# Patient Record
Sex: Male | Born: 1995 | Race: White | Hispanic: No | Marital: Single | Smoking: Current every day smoker
Health system: Southern US, Community
[De-identification: ages and names within clinical notes are randomized; demographics above are authoritative.]

---
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
## 2019-09-10 ENCOUNTER — Emergency Department (HOSPITAL_COMMUNITY)
Admission: EM | Admit: 2019-09-10 | Discharge: 2019-09-10 | Disposition: A | Discharge status: Home / Self Care | Payer: Managed Care, Other (non HMO) | Attending: Emergency Medicine | Admitting: Emergency Medicine

## 2019-09-10 ENCOUNTER — Other Ambulatory Visit: Payer: Self-pay

## 2019-09-10 ENCOUNTER — Emergency Department (HOSPITAL_COMMUNITY): Payer: Managed Care, Other (non HMO)

## 2019-09-10 ENCOUNTER — Encounter (HOSPITAL_COMMUNITY): Payer: Self-pay | Admitting: *Deleted

## 2019-09-10 DIAGNOSIS — Z20828 Contact with and (suspected) exposure to other viral communicable diseases: Secondary | ICD-10-CM | POA: Diagnosis not present

## 2019-09-10 DIAGNOSIS — R112 Nausea with vomiting, unspecified: Secondary | ICD-10-CM | POA: Diagnosis not present

## 2019-09-10 DIAGNOSIS — J069 Acute upper respiratory infection, unspecified: Secondary | ICD-10-CM

## 2019-09-10 DIAGNOSIS — Z20822 Contact with and (suspected) exposure to covid-19: Secondary | ICD-10-CM

## 2019-09-10 DIAGNOSIS — F17200 Nicotine dependence, unspecified, uncomplicated: Secondary | ICD-10-CM | POA: Diagnosis not present

## 2019-09-10 DIAGNOSIS — R05 Cough: Secondary | ICD-10-CM | POA: Diagnosis present

## 2019-09-10 MED ORDER — ONDANSETRON 4 MG PO TBDP
4.0000 mg | ORAL_TABLET | Freq: Once | ORAL | Status: AC
  Administered 2019-09-10: 4 mg via ORAL
  Filled 2019-09-10: qty 1

## 2019-09-10 MED ORDER — ONDANSETRON 4 MG PO TBDP: ORAL_TABLET | ORAL | 0 refills | Status: AC

## 2019-09-10 NOTE — ED Triage Notes (Signed)
Pt c/o intermittent n/v, cough that is productive occasionally, headache, fever, states that his sister was sick with same symptoms but was negative for covid,

## 2019-09-10 NOTE — ED Notes (Signed)
Pt ambulatory in room, no respiratory distress noted. Pt O2 sats remain 95% with ambulation.

## 2019-09-10 NOTE — ED Provider Notes (Signed)
[facility] EMERGENCY DEPARTMENT Provider Note   CSN: [id number] Arrival date & time: 09/10/19  1342     History   Chief Complaint Chief Complaint  Patient presents with  . Cough    HPI Zachary Collier is a 23 y.o. male.     Zachary Collier is a 23 y.o. male who is otherwise healthy, presents to the ED for evaluation of chills, body aches, cough, nausea, vomiting.  Patient reports symptoms have been persistent and not improving.  He reports cough is occasionally productive of clear sputum.  He has had some associated rhinorrhea and sore throat as well.  Patient reports body aches as well as mild headache.  He has had chills but has not taken his temperature.  Reports he has had multiple episodes of vomiting, states this is worse after he has a coughing spell.  He has not had any diarrhea.  He denies abdominal pain.  Denies any chest pain or shortness of breath.  No difficulty breathing.  He has not taken anything for his symptoms prior to arrival.  Reports his sister had a similar illness a few weeks ago, had negative Covid testing when she was having symptoms, and her symptoms resolved on their own.  No other known sick contacts.     History reviewed. No pertinent past medical history.  There are no active problems to display for this patient.   History reviewed. No pertinent surgical history.      Home Medications    Prior to Admission medications   Medication Sig Start Date End Date Taking? Authorizing Provider  ondansetron (ZOFRAN ODT) 4 MG disintegrating tablet 4mg  ODT q4 hours prn nausea/vomit 09/10/19   14/2/20, PA-C    Family History History reviewed. No pertinent family history.  Social History Social History   Tobacco Use  . Smoking status: Current Every Day Smoker  . Smokeless tobacco: Never Used  Substance Use Topics  . Alcohol use: Not on file  . Drug use: Not on file     Allergies   Patient has no known allergies.   Review of  Systems Review of Systems  Constitutional: Positive for chills. Negative for fever.  HENT: Positive for congestion, rhinorrhea and sore throat.   Respiratory: Positive for cough. Negative for shortness of breath.   Cardiovascular: Negative for chest pain.  Gastrointestinal: Positive for nausea and vomiting. Negative for abdominal pain and diarrhea.  Genitourinary: Negative for dysuria.  Musculoskeletal: Positive for myalgias. Negative for neck pain and neck stiffness.  Skin: Negative for color change and rash.  Neurological: Positive for headaches.  All other systems reviewed and are negative.    Physical Exam Updated Vital Signs BP 112/71   Pulse 78   Temp 99.3 F (37.4 C)   Resp 18   Ht 5\' 7"  (1.702 m)   Wt 89.7 kg   SpO2 95%   BMI 30.98 kg/m   Physical Exam Vitals signs and nursing note reviewed.  Constitutional:      General: He is not in acute distress.    Appearance: Normal appearance. He is well-developed and normal weight. He is not ill-appearing or diaphoretic.  HENT:     Head: Normocephalic and atraumatic.     Nose:     Comments: Bilateral nares patent with moderate mucosal edema and clear rhinorrhea present.     Mouth/Throat:     Comments: Posterior oropharynx clear and mucous membranes moist, there is mild erythema but no edema or tonsillar exudates,  uvula midline, normal phonation, no trismus, tolerating secretions without difficulty. Eyes:     General:        Right eye: No discharge.        Left eye: No discharge.  Neck:     Musculoskeletal: Neck supple.     Comments: No rigidity Cardiovascular:     Rate and Rhythm: Normal rate and regular rhythm.     Heart sounds: Normal heart sounds.  Pulmonary:     Effort: Pulmonary effort is normal. No respiratory distress.     Breath sounds: Normal breath sounds.     Comments: Respirations equal and unlabored, patient able to speak in full sentences, lungs clear to auscultation bilaterally Abdominal:      General: Bowel sounds are normal. There is no distension.     Palpations: Abdomen is soft. There is no mass.     Tenderness: There is no abdominal tenderness. There is no guarding.     Comments: Abdomen soft, nondistended, nontender to palpation in all quadrants without guarding or peritoneal signs  Musculoskeletal:        General: No deformity.  Lymphadenopathy:     Cervical: No cervical adenopathy.  Skin:    General: Skin is warm and dry.     Capillary Refill: Capillary refill takes less than 2 seconds.  Neurological:     Mental Status: He is alert and oriented to person, place, and time.  Psychiatric:        Mood and Affect: Mood normal.        Behavior: Behavior normal.      ED Treatments / Results  Labs (all labs ordered are listed, but only abnormal results are displayed) Labs Reviewed  NOVEL CORONAVIRUS, NAA (HOSP ORDER, SEND-OUT TO REF LAB; TAT 18-24 HRS)    EKG None  Radiology Dg Chest Port 1 View  Result Date: 09/10/2019 CLINICAL DATA:  Productive cough. EXAM: PORTABLE CHEST 1 VIEW COMPARISON:  October 28, 2007. FINDINGS: The heart size and mediastinal contours are within normal limits. Both lungs are clear. The visualized skeletal structures are unremarkable. IMPRESSION: No active disease. Electronically Signed   By: Lupita RaiderJames  Green Jr M.D.   On: 09/10/2019 14:58    Procedures Procedures (including critical care time)  Medications Ordered in ED Medications  ondansetron (ZOFRAN-ODT) disintegrating tablet 4 mg (4 mg Oral Given 09/10/19 1523)     Initial Impression / Assessment and Plan / ED Course  I have reviewed the triage vital signs and the nursing notes.  Pertinent labs & imaging results that were available during my care of the patient were reviewed by me and considered in my medical decision making (see chart for details).  Pt presents with nasal congestion, cough, body aches, chills, nausea and vomiting. Pt is well appearing and vitals are normal. Lungs  CTA on exam. Pt CXR negative for acute infiltrate.  Abdomen is nontender to palpation.  Patients symptoms are consistent with viral syndrome, could be flulike illness or COVID-19 infection, Covid testing sent and discussed home quarantine with patient.  He is tolerating p.o. fluids after Zofran here in the department.  Discussed that antibiotics are not indicated for viral infections. Pt will be discharged with symptomatic treatment.  Zofran prescribed.  Verbalizes understanding and is agreeable with plan. Pt is hemodynamically stable & in NAD prior to dc. Return precautions discussed, pt expresses understanding and agrees with plan.  Zachary Collier was evaluated in Emergency Department on 09/12/2019 for the symptoms described in the history of  present illness. He was evaluated in the context of the global COVID-19 pandemic, which necessitated consideration that the patient might be at risk for infection with the SARS-CoV-2 virus that causes COVID-19. Institutional protocols and algorithms that pertain to the evaluation of patients at risk for COVID-19 are in a state of rapid change based on information released by regulatory bodies including the CDC and federal and state organizations. These policies and algorithms were followed during the patient's care in the ED.   Final Clinical Impressions(s) / ED Diagnoses   Final diagnoses:  Viral URI with cough  Non-intractable vomiting with nausea, unspecified vomiting type  Suspected COVID-19 virus infection    ED Discharge Orders         Ordered    ondansetron (ZOFRAN ODT) 4 MG disintegrating tablet     09/10/19 1543           Jacqlyn Larsen, [place] 09/12/19 1007    Fredia Sorrow, MD 09/13/19 1520

## 2019-09-10 NOTE — Discharge Instructions (Signed)
Your symptoms are likely caused by a viral upper respiratory infection.  You have a Covid test pending and should quarantine at home until you a receive your results, if negative you can stop quarantine. Antibiotics are not helpful in treating viral infection, the virus should run its course in about 10-14 days. Please make sure you are drinking plenty of fluids. You can treat your symptoms supportively with tylenol/ibuprofen for fevers and pains, Zyrtec and Flonase to help with nasal congestion, and over the counter cough syrups and throat lozenges to help with cough.  Zofran as needed for nausea and vomiting.  If your symptoms are not improving please follow up with you Primary doctor.   If you develop persistent fevers, shortness of breath or difficulty breathing, chest pain, severe headache and neck pain, persistent nausea and vomiting or other new or concerning symptoms return to the Emergency department.

## 2019-09-11 LAB — NOVEL CORONAVIRUS, NAA (HOSP ORDER, SEND-OUT TO REF LAB; TAT 18-24 HRS): SARS-CoV-2, NAA: NOT DETECTED

## 2019-09-19 ENCOUNTER — Other Ambulatory Visit: Payer: Self-pay

## 2019-09-19 ENCOUNTER — Emergency Department (HOSPITAL_COMMUNITY)
Admission: EM | Admit: 2019-09-19 | Discharge: 2019-09-19 | Disposition: A | Discharge status: Home / Self Care | Payer: Managed Care, Other (non HMO) | Attending: Emergency Medicine | Admitting: Emergency Medicine

## 2019-09-19 ENCOUNTER — Encounter (HOSPITAL_COMMUNITY): Payer: Self-pay | Admitting: Emergency Medicine

## 2019-09-19 DIAGNOSIS — F17210 Nicotine dependence, cigarettes, uncomplicated: Secondary | ICD-10-CM | POA: Insufficient documentation

## 2019-09-19 DIAGNOSIS — R079 Chest pain, unspecified: Secondary | ICD-10-CM | POA: Diagnosis present

## 2019-09-19 DIAGNOSIS — R0789 Other chest pain: Secondary | ICD-10-CM | POA: Diagnosis not present

## 2019-09-19 DIAGNOSIS — F1210 Cannabis abuse, uncomplicated: Secondary | ICD-10-CM | POA: Diagnosis not present

## 2019-09-19 NOTE — ED Provider Notes (Signed)
[facility] EMERGENCY DEPARTMENT Provider Note   CSN: [id number] Arrival date & time: 09/19/19  4580     History Chief Complaint  Patient presents with  . Chest Pain    Zachary Collier is a 23 y.o. male.  HPI    Patient presents with concern of chest pain. Patient states that earlier today, during a conversation with a male to whom he is contracted, she informed him that she is interested in another individual. Since that time he has had chest discomfort.  No dyspnea, no syncope, no vomiting, no cough. Patient has no history of cardiac disease, either as an adult or as a child.  No history of early family cardiac disease either. Patient notes that his heart is broken. History reviewed. No pertinent past medical history.  There are no problems to display for this patient.   History reviewed. No pertinent surgical history.     History reviewed. No pertinent family history.  Social History   Tobacco Use  . Smoking status: Current Every Day Smoker    Packs/day: 1.00    Types: Cigarettes  . Smokeless tobacco: Never Used  Substance Use Topics  . Alcohol use: Yes    Comment: drink one drink once a week  . Drug use: Yes    Types: Marijuana    Comment: daily    Home Medications Prior to Admission medications   Medication Sig Start Date End Date Taking? Authorizing Provider  ondansetron (ZOFRAN ODT) 4 MG disintegrating tablet 4mg  ODT q4 hours prn nausea/vomit 09/10/19   Jacqlyn Larsen, PA-C    Allergies    Patient has no known allergies.  Review of Systems   Review of Systems  Constitutional: Negative for fever.  Respiratory: Negative for shortness of breath.   Cardiovascular: Negative for chest pain.  Musculoskeletal:       Negative aside from HPI  Skin:       Negative aside from HPI  Allergic/Immunologic: Negative for immunocompromised state.  Neurological: Negative for weakness.    Physical Exam Updated Vital Signs BP 116/77 (BP Location: Right  Arm)   Pulse 77   Temp 98 F (36.7 C) (Oral)   Resp 16   Ht 5\' 10"  (1.778 m)   Wt 89.4 kg   SpO2 100%   BMI 28.27 kg/m   Physical Exam Vitals and nursing note reviewed.  Constitutional:      General: He is not in acute distress.    Appearance: He is well-developed.  HENT:     Head: Normocephalic and atraumatic.  Eyes:     Conjunctiva/sclera: Conjunctivae normal.  Cardiovascular:     Rate and Rhythm: Normal rate and regular rhythm.  Pulmonary:     Effort: Pulmonary effort is normal. No respiratory distress.     Breath sounds: No stridor.  Abdominal:     General: There is no distension.  Skin:    General: Skin is warm and dry.  Neurological:     Mental Status: He is alert and oriented to person, place, and time.     ED Results / Procedures / Treatments   Labs (all labs ordered are listed, but only abnormal results are displayed) Labs Reviewed - No data to display  EKG None  Radiology No results found.  Procedures Procedures (including critical care time)  Medications Ordered in ED Medications - No data to display  ED Course  I have reviewed the triage vital signs and the nursing notes.  Pertinent labs & imaging results  that were available during my care of the patient were reviewed by me and considered in my medical decision making (see chart for details).    MDM Rules/Calculators/A&P     CHA2DS2/VAS Stroke Risk Points      N/A >= 2 Points: High Risk  1 - 1.99 Points: Medium Risk  0 Points: Low Risk    A final score could not be computed because of missing components.: Last  Change: N/A     This score determines the patient's risk of having a stroke if the  patient has atrial fibrillation.      This score is not applicable to this patient. Components are not  calculated.                   Young adult male with no known cardiac disease history presents after an emotional encounter with a male peer Patient is awake, alert, with unremarkable  physical exam, vital signs, no evidence for atypical ACS, distress. In addition to the patient's chest pain he attributed to a broken heart, he has a request for work note, this was accommodated. Final Clinical Impression(s) / ED Diagnoses Final diagnoses:  Atypical chest pain    Rx / DC Orders ED Discharge Orders    None       Gerhard Munch, MD 09/19/19 1007

## 2019-09-19 NOTE — Discharge Instructions (Signed)
As discussed, your evaluation today has been largely reassuring.  But, it is important that you monitor your condition carefully, and do not hesitate to return to the ED if you develop new, or concerning changes in your condition. ? ?Otherwise, please follow-up with your physician for appropriate ongoing care. ? ?

## 2019-09-19 NOTE — ED Triage Notes (Signed)
Patient was sent home from work today for chest pain and BP.  States his heart was broken.  EMS came and checked him out and told him he needed to be cleared to work.

## 2020-02-02 ENCOUNTER — Encounter (HOSPITAL_COMMUNITY): Payer: Self-pay | Admitting: Student in an Organized Health Care Education/Training Program

## 2020-02-02 ENCOUNTER — Emergency Department (HOSPITAL_COMMUNITY): Payer: Managed Care, Other (non HMO) | Admitting: Certified Registered Nurse Anesthetist

## 2020-02-02 ENCOUNTER — Inpatient Hospital Stay (HOSPITAL_COMMUNITY)
Admission: EM | Admit: 2020-02-02 | Discharge: 2020-02-07 | DRG: 959 | Disposition: A | Discharge status: Home / Self Care | Payer: Managed Care, Other (non HMO) | Attending: General Surgery | Admitting: General Surgery

## 2020-02-02 ENCOUNTER — Encounter (HOSPITAL_COMMUNITY): Admission: EM | Disposition: A | Discharge status: Home / Self Care | Payer: Self-pay | Source: Home / Self Care

## 2020-02-02 ENCOUNTER — Emergency Department (HOSPITAL_COMMUNITY): Payer: Managed Care, Other (non HMO)

## 2020-02-02 DIAGNOSIS — S31139A Puncture wound of abdominal wall without foreign body, unspecified quadrant without penetration into peritoneal cavity, initial encounter: Secondary | ICD-10-CM

## 2020-02-02 DIAGNOSIS — Z20822 Contact with and (suspected) exposure to covid-19: Secondary | ICD-10-CM | POA: Diagnosis present

## 2020-02-02 DIAGNOSIS — Z9889 Other specified postprocedural states: Secondary | ICD-10-CM

## 2020-02-02 DIAGNOSIS — R109 Unspecified abdominal pain: Secondary | ICD-10-CM | POA: Diagnosis present

## 2020-02-02 DIAGNOSIS — W3400XA Accidental discharge from unspecified firearms or gun, initial encounter: Secondary | ICD-10-CM

## 2020-02-02 DIAGNOSIS — S36591A Other injury of transverse colon, initial encounter: Secondary | ICD-10-CM | POA: Diagnosis present

## 2020-02-02 DIAGNOSIS — T1490XA Injury, unspecified, initial encounter: Secondary | ICD-10-CM

## 2020-02-02 DIAGNOSIS — W320XXA Accidental handgun discharge, initial encounter: Secondary | ICD-10-CM

## 2020-02-02 DIAGNOSIS — F17210 Nicotine dependence, cigarettes, uncomplicated: Secondary | ICD-10-CM | POA: Diagnosis present

## 2020-02-02 DIAGNOSIS — S36118A Other injury of liver, initial encounter: Secondary | ICD-10-CM | POA: Diagnosis present

## 2020-02-02 DIAGNOSIS — S31140A Puncture wound of abdominal wall with foreign body, right upper quadrant without penetration into peritoneal cavity, initial encounter: Secondary | ICD-10-CM | POA: Diagnosis present

## 2020-02-02 DIAGNOSIS — S37091A Other injury of right kidney, initial encounter: Secondary | ICD-10-CM | POA: Diagnosis present

## 2020-02-02 DIAGNOSIS — R52 Pain, unspecified: Secondary | ICD-10-CM

## 2020-02-02 HISTORY — PX: LAPAROTOMY: SHX154

## 2020-02-02 LAB — COMPREHENSIVE METABOLIC PANEL
ALT: 28 U/L (ref 0–44)
AST: 32 U/L (ref 15–41)
Albumin: 4.2 g/dL (ref 3.5–5.0)
Alkaline Phosphatase: 59 U/L (ref 38–126)
Anion gap: 10 (ref 5–15)
BUN: 8 mg/dL (ref 6–20)
CO2: 24 mmol/L (ref 22–32)
Calcium: 9.4 mg/dL (ref 8.9–10.3)
Chloride: 105 mmol/L (ref 98–111)
Creatinine, Ser: 1.04 mg/dL (ref 0.61–1.24)
GFR calc Af Amer: >60 mL/min (ref >60)
GFR calc non Af Amer: >60 mL/min (ref >60)
Glucose, Bld: 109 mg/dL — ABNORMAL HIGH (ref 70–99)
Potassium: 3.7 mmol/L (ref 3.5–5.1)
Sodium: 139 mmol/L (ref 135–145)
Total Bilirubin: 0.6 mg/dL (ref 0.3–1.2)
Total Protein: 6.5 g/dL (ref 6.5–8.1)

## 2020-02-02 LAB — I-STAT CHEM 8, ED
BUN: 7 mg/dL (ref 6–20)
Calcium, Ion: 1.19 mmol/L (ref 1.15–1.40)
Chloride: 108 mmol/L (ref 98–111)
Creatinine, Ser: 0.9 mg/dL (ref 0.61–1.24)
Glucose, Bld: 103 mg/dL — ABNORMAL HIGH (ref 70–99)
HCT: 46 % (ref 39.0–52.0)
Hemoglobin: 15.6 g/dL (ref 13.0–17.0)
Potassium: 3.7 mmol/L (ref 3.5–5.1)
Sodium: 143 mmol/L (ref 135–145)
TCO2: 26 mmol/L (ref 22–32)

## 2020-02-02 LAB — PROTIME-INR
INR: 0.9 (ref 0.8–1.2)
Prothrombin Time: 12.1 seconds (ref 11.4–15.2)

## 2020-02-02 LAB — RESPIRATORY PANEL BY RT PCR (FLU A&B, COVID)
Influenza A by PCR: NEGATIVE
Influenza B by PCR: NEGATIVE
SARS Coronavirus 2 by RT PCR: NEGATIVE

## 2020-02-02 LAB — SAMPLE TO BLOOD BANK

## 2020-02-02 LAB — LACTIC ACID, PLASMA: Lactic Acid, Venous: 1.9 mmol/L (ref 0.5–1.9)

## 2020-02-02 LAB — CBC
HCT: 47.9 % (ref 39.0–52.0)
Hemoglobin: 15.6 g/dL (ref 13.0–17.0)
MCH: 31.4 pg (ref 26.0–34.0)
MCHC: 32.6 g/dL (ref 30.0–36.0)
MCV: 96.4 fL (ref 80.0–100.0)
Platelets: 294 10*3/uL (ref 150–400)
RBC: 4.97 MIL/uL (ref 4.22–5.81)
RDW: 13.2 % (ref 11.5–15.5)
WBC: 10.9 10*3/uL — ABNORMAL HIGH (ref 4.0–10.5)
nRBC: 0 % (ref 0.0–0.2)

## 2020-02-02 LAB — ETHANOL: Alcohol, Ethyl (B): 21 mg/dL — ABNORMAL HIGH (ref <10)

## 2020-02-02 SURGERY — LAPAROTOMY, EXPLORATORY
Anesthesia: General | Site: Abdomen

## 2020-02-02 MED ORDER — GABAPENTIN 300 MG PO CAPS
300.0000 mg | ORAL_CAPSULE | Freq: Two times a day (BID) | ORAL | Status: DC
  Administered 2020-02-03 – 2020-02-07 (×7): 300 mg via ORAL
  Filled 2020-02-02 (×8): qty 1

## 2020-02-02 MED ORDER — ACETAMINOPHEN 325 MG PO TABS: 650.0000 mg | ORAL_TABLET | Freq: Four times a day (QID) | ORAL | Status: DC | PRN

## 2020-02-02 MED ORDER — PROCHLORPERAZINE EDISYLATE 10 MG/2ML IJ SOLN: 5.0000 mg | Freq: Four times a day (QID) | INTRAMUSCULAR | Status: DC | PRN

## 2020-02-02 MED ORDER — ONDANSETRON HCL 4 MG/2ML IJ SOLN
INTRAMUSCULAR | Status: AC
  Filled 2020-02-02: qty 2

## 2020-02-02 MED ORDER — LACTATED RINGERS IV SOLN: INTRAVENOUS | Status: DC | PRN

## 2020-02-02 MED ORDER — KETOROLAC TROMETHAMINE 30 MG/ML IJ SOLN
INTRAMUSCULAR | Status: DC | PRN
Start: 2020-02-02 — End: 2020-02-02
  Administered 2020-02-02: 30 mg via INTRAVENOUS

## 2020-02-02 MED ORDER — SUGAMMADEX SODIUM 200 MG/2ML IV SOLN
INTRAVENOUS | Status: DC | PRN
Start: 2020-02-02 — End: 2020-02-02
  Administered 2020-02-02: 400 mg via INTRAVENOUS

## 2020-02-02 MED ORDER — MORPHINE SULFATE 2 MG/ML IV SOLN
INTRAVENOUS | Status: AC
  Filled 2020-02-02: qty 30

## 2020-02-02 MED ORDER — MORPHINE SULFATE 2 MG/ML IV SOLN
INTRAVENOUS | Status: DC
  Administered 2020-02-03: 15 mg via INTRAVENOUS
  Administered 2020-02-03: 1.5 mg via INTRAVENOUS
  Administered 2020-02-03: 8.74 mg via INTRAVENOUS
  Administered 2020-02-03: 6 mg via INTRAVENOUS
  Administered 2020-02-04: 4.5 mg via INTRAVENOUS
  Administered 2020-02-04: 7.5 mg via INTRAVENOUS
  Administered 2020-02-04: 11.59 mg via INTRAVENOUS
  Filled 2020-02-02: qty 30

## 2020-02-02 MED ORDER — DIPHENHYDRAMINE HCL 12.5 MG/5ML PO ELIX: 12.5000 mg | ORAL_SOLUTION | Freq: Four times a day (QID) | ORAL | Status: DC | PRN

## 2020-02-02 MED ORDER — SUCCINYLCHOLINE CHLORIDE 20 MG/ML IJ SOLN
INTRAMUSCULAR | Status: DC | PRN
  Administered 2020-02-02: 100 mg via INTRAVENOUS

## 2020-02-02 MED ORDER — MIDAZOLAM HCL 5 MG/5ML IJ SOLN
INTRAMUSCULAR | Status: DC | PRN
  Administered 2020-02-02: 2 mg via INTRAVENOUS

## 2020-02-02 MED ORDER — FENTANYL CITRATE (PF) 100 MCG/2ML IJ SOLN
INTRAMUSCULAR | Status: DC | PRN
  Administered 2020-02-02: 100 ug via INTRAVENOUS
  Administered 2020-02-02 (×3): 50 ug via INTRAVENOUS

## 2020-02-02 MED ORDER — SODIUM CHLORIDE 0.9% FLUSH: 9.0000 mL | INTRAVENOUS | Status: DC | PRN

## 2020-02-02 MED ORDER — PROPOFOL 10 MG/ML IV BOLUS
INTRAVENOUS | Status: AC
  Filled 2020-02-02: qty 20

## 2020-02-02 MED ORDER — NALOXONE HCL 0.4 MG/ML IJ SOLN: 0.4000 mg | INTRAMUSCULAR | Status: DC | PRN

## 2020-02-02 MED ORDER — DIPHENHYDRAMINE HCL 50 MG/ML IJ SOLN: 12.5000 mg | Freq: Four times a day (QID) | INTRAMUSCULAR | Status: DC | PRN

## 2020-02-02 MED ORDER — KETOROLAC TROMETHAMINE 30 MG/ML IJ SOLN
INTRAMUSCULAR | Status: AC
  Filled 2020-02-02: qty 1

## 2020-02-02 MED ORDER — KETOROLAC TROMETHAMINE 30 MG/ML IJ SOLN: 30.0000 mg | Freq: Four times a day (QID) | INTRAMUSCULAR | Status: DC | PRN

## 2020-02-02 MED ORDER — ACETAMINOPHEN 650 MG RE SUPP: 650.0000 mg | Freq: Four times a day (QID) | RECTAL | Status: DC | PRN

## 2020-02-02 MED ORDER — MIDAZOLAM HCL 2 MG/2ML IJ SOLN: 0.5000 mg | Freq: Once | INTRAMUSCULAR | Status: DC | PRN

## 2020-02-02 MED ORDER — MEPERIDINE HCL 25 MG/ML IJ SOLN: 6.2500 mg | INTRAMUSCULAR | Status: DC | PRN

## 2020-02-02 MED ORDER — LIDOCAINE HCL (CARDIAC) PF 100 MG/5ML IV SOSY
PREFILLED_SYRINGE | INTRAVENOUS | Status: DC | PRN
  Administered 2020-02-02: 30 mg via INTRAVENOUS

## 2020-02-02 MED ORDER — ROCURONIUM BROMIDE 10 MG/ML (PF) SYRINGE
PREFILLED_SYRINGE | INTRAVENOUS | Status: AC
  Filled 2020-02-02: qty 10

## 2020-02-02 MED ORDER — MIDAZOLAM HCL 2 MG/2ML IJ SOLN
INTRAMUSCULAR | Status: AC
  Filled 2020-02-02: qty 2

## 2020-02-02 MED ORDER — PROMETHAZINE HCL 25 MG/ML IJ SOLN: 6.2500 mg | INTRAMUSCULAR | Status: DC | PRN

## 2020-02-02 MED ORDER — HYDROMORPHONE HCL 1 MG/ML IJ SOLN
INTRAMUSCULAR | Status: AC
  Filled 2020-02-02: qty 1

## 2020-02-02 MED ORDER — DEXMEDETOMIDINE HCL 200 MCG/2ML IV SOLN
INTRAVENOUS | Status: DC | PRN
  Administered 2020-02-02 (×5): 8 ug via INTRAVENOUS

## 2020-02-02 MED ORDER — PROPOFOL 10 MG/ML IV BOLUS
INTRAVENOUS | Status: DC | PRN
  Administered 2020-02-02: 200 mg via INTRAVENOUS

## 2020-02-02 MED ORDER — SODIUM CHLORIDE 0.9 % IV SOLN
2.0000 g | Freq: Two times a day (BID) | INTRAVENOUS | Status: AC
  Administered 2020-02-03: 2 g via INTRAVENOUS
  Filled 2020-02-02: qty 2

## 2020-02-02 MED ORDER — 0.9 % SODIUM CHLORIDE (POUR BTL) OPTIME
TOPICAL | Status: DC | PRN
  Administered 2020-02-02 (×2): 2000 mL

## 2020-02-02 MED ORDER — PROCHLORPERAZINE MALEATE 10 MG PO TABS
10.0000 mg | ORAL_TABLET | Freq: Four times a day (QID) | ORAL | Status: DC | PRN
  Filled 2020-02-02: qty 1

## 2020-02-02 MED ORDER — DEXAMETHASONE SODIUM PHOSPHATE 10 MG/ML IJ SOLN
INTRAMUSCULAR | Status: AC
  Filled 2020-02-02: qty 1

## 2020-02-02 MED ORDER — CEFAZOLIN SODIUM-DEXTROSE 2-4 GM/100ML-% IV SOLN
2.0000 g | Freq: Once | INTRAVENOUS | Status: AC
  Administered 2020-02-02: 2 g via INTRAVENOUS
  Filled 2020-02-02: qty 100

## 2020-02-02 MED ORDER — HYDROMORPHONE HCL 1 MG/ML IJ SOLN
0.2500 mg | INTRAMUSCULAR | Status: DC | PRN
  Administered 2020-02-02 (×2): 0.25 mg via INTRAVENOUS

## 2020-02-02 MED ORDER — ENOXAPARIN SODIUM 40 MG/0.4ML ~~LOC~~ SOLN: 40.0000 mg | SUBCUTANEOUS | Status: DC

## 2020-02-02 MED ORDER — TETANUS-DIPHTH-ACELL PERTUSSIS 5-2.5-18.5 LF-MCG/0.5 IM SUSP
0.5000 mL | Freq: Once | INTRAMUSCULAR | Status: AC
  Administered 2020-02-02: 0.5 mL via INTRAMUSCULAR
  Filled 2020-02-02: qty 0.5

## 2020-02-02 MED ORDER — SUCCINYLCHOLINE CHLORIDE 200 MG/10ML IV SOSY
PREFILLED_SYRINGE | INTRAVENOUS | Status: AC
  Filled 2020-02-02: qty 10

## 2020-02-02 MED ORDER — FENTANYL CITRATE (PF) 250 MCG/5ML IJ SOLN
INTRAMUSCULAR | Status: AC
  Filled 2020-02-02: qty 5

## 2020-02-02 MED ORDER — ONDANSETRON HCL 4 MG/2ML IJ SOLN: 4.0000 mg | Freq: Four times a day (QID) | INTRAMUSCULAR | Status: DC | PRN

## 2020-02-02 MED ORDER — KETOROLAC TROMETHAMINE 30 MG/ML IJ SOLN
30.0000 mg | Freq: Four times a day (QID) | INTRAMUSCULAR | Status: AC
  Administered 2020-02-03 (×4): 30 mg via INTRAVENOUS
  Filled 2020-02-02 (×4): qty 1

## 2020-02-02 MED ORDER — ONDANSETRON HCL 4 MG/2ML IJ SOLN
INTRAMUSCULAR | Status: DC | PRN
  Administered 2020-02-02: 4 mg via INTRAVENOUS

## 2020-02-02 MED ORDER — ROCURONIUM BROMIDE 100 MG/10ML IV SOLN
INTRAVENOUS | Status: DC | PRN
  Administered 2020-02-02: 50 mg via INTRAVENOUS

## 2020-02-02 MED ORDER — PANTOPRAZOLE SODIUM 40 MG IV SOLR
40.0000 mg | Freq: Every day | INTRAVENOUS | Status: DC
  Administered 2020-02-03 (×2): 40 mg via INTRAVENOUS
  Filled 2020-02-02 (×2): qty 40

## 2020-02-02 MED ORDER — DEXAMETHASONE SODIUM PHOSPHATE 4 MG/ML IJ SOLN
INTRAMUSCULAR | Status: DC | PRN
  Administered 2020-02-02: 5 mg via INTRAVENOUS

## 2020-02-02 SURGICAL SUPPLY — 48 items
BLADE CLIPPER SURG (BLADE) IMPLANT
CANISTER SUCT 3000ML PPV (MISCELLANEOUS) ×3 IMPLANT
CHLORAPREP W/TINT 26 (MISCELLANEOUS) ×3 IMPLANT
COVER SURGICAL LIGHT HANDLE (MISCELLANEOUS) ×3 IMPLANT
COVER WAND RF STERILE (DRAPES) IMPLANT
DRAPE LAPAROSCOPIC ABDOMINAL (DRAPES) ×3 IMPLANT
DRAPE WARM FLUID 44X44 (DRAPES) ×3 IMPLANT
DRSG COVADERM 4X14 (GAUZE/BANDAGES/DRESSINGS) ×3 IMPLANT
DRSG COVADERM PLUS 2X2 (GAUZE/BANDAGES/DRESSINGS) ×3 IMPLANT
DRSG OPSITE POSTOP 4X10 (GAUZE/BANDAGES/DRESSINGS) IMPLANT
DRSG OPSITE POSTOP 4X8 (GAUZE/BANDAGES/DRESSINGS) IMPLANT
ELECT BLADE 6.5 EXT (BLADE) IMPLANT
ELECT CAUTERY BLADE 6.4 (BLADE) ×3 IMPLANT
ELECT REM PT RETURN 9FT ADLT (ELECTROSURGICAL) ×3
ELECTRODE REM PT RTRN 9FT ADLT (ELECTROSURGICAL) ×1 IMPLANT
FELT TEFLON 1X6 (MISCELLANEOUS) ×3 IMPLANT
GLOVE BIO SURGEON STRL SZ 6 (GLOVE) ×6 IMPLANT
GLOVE INDICATOR 6.5 STRL GRN (GLOVE) ×6 IMPLANT
GOWN STRL REUS W/ TWL LRG LVL3 (GOWN DISPOSABLE) ×1 IMPLANT
GOWN STRL REUS W/TWL 2XL LVL3 (GOWN DISPOSABLE) ×3 IMPLANT
GOWN STRL REUS W/TWL LRG LVL3 (GOWN DISPOSABLE) ×2
HANDLE SUCTION POOLE (INSTRUMENTS) ×1 IMPLANT
KIT BASIN OR (CUSTOM PROCEDURE TRAY) ×3 IMPLANT
KIT TURNOVER KIT B (KITS) ×3 IMPLANT
LIGASURE IMPACT 36 18CM CVD LR (INSTRUMENTS) IMPLANT
NS IRRIG 1000ML POUR BTL (IV SOLUTION) ×6 IMPLANT
PACK GENERAL/GYN (CUSTOM PROCEDURE TRAY) ×3 IMPLANT
PAD ARMBOARD 7.5X6 YLW CONV (MISCELLANEOUS) ×3 IMPLANT
PENCIL SMOKE EVACUATOR (MISCELLANEOUS) ×3 IMPLANT
SPECIMEN JAR LARGE (MISCELLANEOUS) IMPLANT
SPONGE LAP 18X18 RF (DISPOSABLE) ×3 IMPLANT
STAPLER VISISTAT 35W (STAPLE) ×3 IMPLANT
SUCTION POOLE HANDLE (INSTRUMENTS) ×3
SUT CHROMIC 0 BP (SUTURE) ×3 IMPLANT
SUT PDS AB 1 TP1 96 (SUTURE) ×6 IMPLANT
SUT PDS II 0 TP-1 LOOPED 60 (SUTURE) IMPLANT
SUT PROLENE 2 0 SH DA (SUTURE) ×6 IMPLANT
SUT SILK 2 0 TIES 10X30 (SUTURE) ×3 IMPLANT
SUT SILK 3 0 TIES 10X30 (SUTURE) ×3 IMPLANT
SUT VIC AB 2-0 SH 18 (SUTURE) ×3 IMPLANT
SUT VIC AB 3-0 SH 18 (SUTURE) ×3 IMPLANT
SUT VICRYL 4-0 PS2 18IN ABS (SUTURE) IMPLANT
SUT VICRYL AB 2 0 TIES (SUTURE) IMPLANT
SUT VICRYL AB 3 0 TIES (SUTURE) IMPLANT
TOWEL GREEN STERILE (TOWEL DISPOSABLE) ×3 IMPLANT
TOWEL GREEN STERILE FF (TOWEL DISPOSABLE) ×3 IMPLANT
TRAY FOLEY MTR SLVR 16FR STAT (SET/KITS/TRAYS/PACK) IMPLANT
YANKAUER SUCT BULB TIP NO VENT (SUCTIONS) IMPLANT
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
## 2020-02-02 NOTE — ED Notes (Signed)
Attempted to call pt's father, Raiford Noble, with no answer. Father's number: [phone]

## 2020-02-02 NOTE — Anesthesia Postprocedure Evaluation (Signed)
Anesthesia Post Note  Patient: Zachary Collier  Procedure(s) Performed: EXPLORATORY LAPAROTOMY, Primary Repair Colon Injury (N/A Abdomen)     Patient location during evaluation: PACU Anesthesia Type: General Level of consciousness: sedated, oriented and patient cooperative Pain management: pain level controlled Vital Signs Assessment: post-procedure vital signs reviewed and stable Respiratory status: spontaneous breathing, nonlabored ventilation and respiratory function stable Cardiovascular status: blood pressure returned to baseline and stable Postop Assessment: no apparent nausea or vomiting Anesthetic complications: no    Last Vitals:  Vitals:   02/02/20 2300 02/02/20 2315  BP: 123/73 117/69  Pulse: 90 78  Resp: 19 15  Temp:    SpO2: 100% 99%    Last Pain:  Vitals:   02/02/20 2315  TempSrc:   PainSc: Asleep                 Lovel Suazo,E. Sylva Overley

## 2020-02-02 NOTE — Transfer of Care (Signed)
Immediate Anesthesia Transfer of Care Note  Patient: Zachary Collier  Procedure(s) Performed: EXPLORATORY LAPAROTOMY, Primary Repair Colon Injury (N/A Abdomen)  Patient Location: PACU  Anesthesia Type:General  Level of Consciousness: awake and drowsy  Airway & Oxygen Therapy: Patient Spontanous Breathing and Patient connected to face mask oxygen  Post-op Assessment: Report given to RN and Post -op Vital signs reviewed and stable  Post vital signs: Reviewed and stable  Last Vitals:  Vitals Value Taken Time  BP 131/86 02/02/20 2236  Temp 36.5 C 02/02/20 2233  Pulse 98 02/02/20 2240  Resp 24 02/02/20 2240  SpO2 95 % 02/02/20 2240  Vitals shown include unvalidated device data.  Last Pain:  Vitals:   02/02/20 1934  TempSrc: Temporal         Complications: No apparent anesthesia complications
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
## 2020-02-02 NOTE — Anesthesia Procedure Notes (Signed)
Procedure Name: Intubation Date/Time: 02/02/2020 9:10 PM Performed by: Oletta Lamas, CRNA Pre-anesthesia Checklist: Patient identified, Emergency Drugs available, Suction available and Patient being monitored Patient Re-evaluated:Patient Re-evaluated prior to induction Oxygen Delivery Method: Circle System Utilized Preoxygenation: Pre-oxygenation with 100% oxygen Induction Type: IV induction Ventilation: Mask ventilation without difficulty Laryngoscope Size: Mac and 4 Grade View: Grade I Tube type: Oral Number of attempts: 1 Airway Equipment and Method: Stylet and Oral airway Placement Confirmation: ETT inserted through vocal cords under direct vision,  positive ETCO2 and breath sounds checked- equal and bilateral Secured at: 23 cm Tube secured with: Tape Dental Injury: Teeth and Oropharynx as per pre-operative assessment

## 2020-02-03 ENCOUNTER — Other Ambulatory Visit: Payer: Self-pay

## 2020-02-03 ENCOUNTER — Inpatient Hospital Stay (HOSPITAL_COMMUNITY): Payer: Managed Care, Other (non HMO) | Admitting: Anesthesiology

## 2020-02-03 ENCOUNTER — Encounter (HOSPITAL_COMMUNITY): Payer: Self-pay

## 2020-02-03 ENCOUNTER — Inpatient Hospital Stay (HOSPITAL_COMMUNITY): Payer: Managed Care, Other (non HMO)

## 2020-02-03 ENCOUNTER — Encounter (HOSPITAL_COMMUNITY)
Admission: EM | Disposition: A | Discharge status: Home / Self Care | Payer: Managed Care, Other (non HMO) | Source: Home / Self Care

## 2020-02-03 HISTORY — PX: TOTAL COLECTOMY: SHX852

## 2020-02-03 HISTORY — PX: COLECTOMY: SHX59

## 2020-02-03 LAB — CBC
HCT: 44.7 % (ref 39.0–52.0)
HCT: 41.8 % (ref 39.0–52.0)
Hemoglobin: 14.6 g/dL (ref 13.0–17.0)
Hemoglobin: 13.6 g/dL (ref 13.0–17.0)
MCH: 30.7 pg (ref 26.0–34.0)
MCH: 30.6 pg (ref 26.0–34.0)
MCHC: 32.7 g/dL (ref 30.0–36.0)
MCHC: 32.5 g/dL (ref 30.0–36.0)
MCV: 94.1 fL (ref 80.0–100.0)
MCV: 93.9 fL (ref 80.0–100.0)
Platelets: 261 10*3/uL (ref 150–400)
Platelets: 253 10*3/uL (ref 150–400)
RBC: 4.75 MIL/uL (ref 4.22–5.81)
RBC: 4.45 MIL/uL (ref 4.22–5.81)
RDW: 13.1 % (ref 11.5–15.5)
RDW: 13.2 % (ref 11.5–15.5)
WBC: 16.5 10*3/uL — ABNORMAL HIGH (ref 4.0–10.5)
WBC: 18.2 10*3/uL — ABNORMAL HIGH (ref 4.0–10.5)
nRBC: 0 % (ref 0.0–0.2)
nRBC: 0 % (ref 0.0–0.2)

## 2020-02-03 LAB — RAPID URINE DRUG SCREEN, HOSP PERFORMED
Amphetamines: NOT DETECTED
Barbiturates: NOT DETECTED
Benzodiazepines: POSITIVE — AB
Cocaine: NOT DETECTED
Opiates: POSITIVE — AB
Tetrahydrocannabinol: POSITIVE — AB

## 2020-02-03 LAB — TYPE AND SCREEN
ABO/RH(D): O POS
Antibody Screen: NEGATIVE

## 2020-02-03 LAB — URINALYSIS, COMPLETE (UACMP) WITH MICROSCOPIC
Bacteria, UA: NONE SEEN
Bilirubin Urine: NEGATIVE
Glucose, UA: NEGATIVE mg/dL
Ketones, ur: NEGATIVE mg/dL
Leukocytes,Ua: NEGATIVE
Nitrite: NEGATIVE
Protein, ur: NEGATIVE mg/dL
Specific Gravity, Urine: 1.034 — ABNORMAL HIGH (ref 1.005–1.030)
pH: 7 (ref 5.0–8.0)

## 2020-02-03 LAB — BASIC METABOLIC PANEL
Anion gap: 8 (ref 5–15)
BUN: 6 mg/dL (ref 6–20)
CO2: 25 mmol/L (ref 22–32)
Calcium: 8.9 mg/dL (ref 8.9–10.3)
Chloride: 109 mmol/L (ref 98–111)
Creatinine, Ser: 1.02 mg/dL (ref 0.61–1.24)
GFR calc Af Amer: >60 mL/min (ref >60)
GFR calc non Af Amer: >60 mL/min (ref >60)
Glucose, Bld: 122 mg/dL — ABNORMAL HIGH (ref 70–99)
Potassium: 4.2 mmol/L (ref 3.5–5.1)
Sodium: 142 mmol/L (ref 135–145)

## 2020-02-03 LAB — ABO/RH: ABO/RH(D): O POS

## 2020-02-03 SURGERY — COLECTOMY, TOTAL
Anesthesia: General | Laterality: Right

## 2020-02-03 MED ORDER — DEXAMETHASONE SODIUM PHOSPHATE 10 MG/ML IJ SOLN
INTRAMUSCULAR | Status: AC
  Filled 2020-02-03: qty 1

## 2020-02-03 MED ORDER — ACETAMINOPHEN 325 MG PO TABS
650.0000 mg | ORAL_TABLET | Freq: Four times a day (QID) | ORAL | Status: DC
  Administered 2020-02-03 – 2020-02-04 (×5): 650 mg via ORAL
  Filled 2020-02-03 (×5): qty 2

## 2020-02-03 MED ORDER — SUGAMMADEX SODIUM 200 MG/2ML IV SOLN
INTRAVENOUS | Status: DC | PRN
  Administered 2020-02-03: 200 mg via INTRAVENOUS

## 2020-02-03 MED ORDER — ROCURONIUM BROMIDE 10 MG/ML (PF) SYRINGE
PREFILLED_SYRINGE | INTRAVENOUS | Status: DC | PRN
  Administered 2020-02-03: 20 mg via INTRAVENOUS
  Administered 2020-02-03: 40 mg via INTRAVENOUS

## 2020-02-03 MED ORDER — LACTATED RINGERS IV SOLN: INTRAVENOUS | Status: DC

## 2020-02-03 MED ORDER — FENTANYL CITRATE (PF) 250 MCG/5ML IJ SOLN
INTRAMUSCULAR | Status: DC | PRN
  Administered 2020-02-03: 50 ug via INTRAVENOUS
  Administered 2020-02-03 (×2): 100 ug via INTRAVENOUS

## 2020-02-03 MED ORDER — DEXMEDETOMIDINE HCL 200 MCG/2ML IV SOLN
INTRAVENOUS | Status: DC | PRN
  Administered 2020-02-03 (×2): 8 ug via INTRAVENOUS

## 2020-02-03 MED ORDER — PHENYLEPHRINE 40 MCG/ML (10ML) SYRINGE FOR IV PUSH (FOR BLOOD PRESSURE SUPPORT)
PREFILLED_SYRINGE | INTRAVENOUS | Status: DC | PRN
  Administered 2020-02-03 (×2): 80 ug via INTRAVENOUS

## 2020-02-03 MED ORDER — FENTANYL CITRATE (PF) 250 MCG/5ML IJ SOLN
INTRAMUSCULAR | Status: AC
  Filled 2020-02-03: qty 5

## 2020-02-03 MED ORDER — PROPOFOL 10 MG/ML IV BOLUS
INTRAVENOUS | Status: AC
  Filled 2020-02-03: qty 20

## 2020-02-03 MED ORDER — PROPOFOL 10 MG/ML IV BOLUS
INTRAVENOUS | Status: DC | PRN
  Administered 2020-02-03: 100 mg via INTRAVENOUS

## 2020-02-03 MED ORDER — MIDAZOLAM HCL 2 MG/2ML IJ SOLN
INTRAMUSCULAR | Status: DC | PRN
  Administered 2020-02-03: 2 mg via INTRAVENOUS

## 2020-02-03 MED ORDER — 0.9 % SODIUM CHLORIDE (POUR BTL) OPTIME
TOPICAL | Status: DC | PRN
  Administered 2020-02-03: 09:00:00 2000 mL
  Administered 2020-02-03: 1000 mL

## 2020-02-03 MED ORDER — DEXAMETHASONE SODIUM PHOSPHATE 10 MG/ML IJ SOLN
INTRAMUSCULAR | Status: DC | PRN
  Administered 2020-02-03: 5 mg via INTRAVENOUS

## 2020-02-03 MED ORDER — MIDAZOLAM HCL 2 MG/2ML IJ SOLN
INTRAMUSCULAR | Status: AC
  Filled 2020-02-03: qty 2

## 2020-02-03 MED ORDER — PHENYLEPHRINE HCL-NACL 10-0.9 MG/250ML-% IV SOLN
INTRAVENOUS | Status: DC | PRN
  Administered 2020-02-03: 25 ug/min via INTRAVENOUS
  Administered 2020-02-03: 50 ug/min via INTRAVENOUS

## 2020-02-03 MED ORDER — LIDOCAINE 2% (20 MG/ML) 5 ML SYRINGE
INTRAMUSCULAR | Status: DC | PRN
  Administered 2020-02-03: 60 mg via INTRAVENOUS

## 2020-02-03 MED ORDER — LIDOCAINE 2% (20 MG/ML) 5 ML SYRINGE
INTRAMUSCULAR | Status: AC
  Filled 2020-02-03: qty 5

## 2020-02-03 MED ORDER — METHOCARBAMOL 1000 MG/10ML IJ SOLN
500.0000 mg | Freq: Four times a day (QID) | INTRAVENOUS | Status: DC | PRN
  Filled 2020-02-03: qty 5

## 2020-02-03 MED ORDER — ONDANSETRON HCL 4 MG/2ML IJ SOLN: 4.0000 mg | Freq: Once | INTRAMUSCULAR | Status: DC | PRN

## 2020-02-03 MED ORDER — PROPOFOL 10 MG/ML IV BOLUS
INTRAVENOUS | Status: AC
  Filled 2020-02-03: qty 40

## 2020-02-03 MED ORDER — SUGAMMADEX SODIUM 200 MG/2ML IV SOLN: INTRAVENOUS | Status: DC | PRN

## 2020-02-03 MED ORDER — IOHEXOL 300 MG/ML  SOLN
100.0000 mL | Freq: Once | INTRAMUSCULAR | Status: AC | PRN
  Administered 2020-02-03: 100 mL via INTRAVENOUS

## 2020-02-03 MED ORDER — ONDANSETRON HCL 4 MG/2ML IJ SOLN
INTRAMUSCULAR | Status: DC | PRN
  Administered 2020-02-03: 4 mg via INTRAVENOUS

## 2020-02-03 MED ORDER — HYDROMORPHONE HCL 1 MG/ML IJ SOLN: 0.2500 mg | INTRAMUSCULAR | Status: DC | PRN

## 2020-02-03 MED ORDER — ONDANSETRON HCL 4 MG/2ML IJ SOLN
INTRAMUSCULAR | Status: AC
  Filled 2020-02-03: qty 2

## 2020-02-03 MED ORDER — SODIUM CHLORIDE 0.9 % IV SOLN
2.0000 g | Freq: Two times a day (BID) | INTRAVENOUS | Status: DC
  Administered 2020-02-03 – 2020-02-07 (×9): 2 g via INTRAVENOUS
  Filled 2020-02-03 (×10): qty 2

## 2020-02-03 MED ORDER — LACTATED RINGERS IV SOLN: INTRAVENOUS | Status: DC | PRN

## 2020-02-03 SURGICAL SUPPLY — 57 items
BLADE CLIPPER SURG (BLADE) IMPLANT
CANISTER SUCT 3000ML PPV (MISCELLANEOUS) ×3 IMPLANT
CHLORAPREP W/TINT 26 (MISCELLANEOUS) ×3 IMPLANT
COVER MAYO STAND STRL (DRAPES) ×6 IMPLANT
COVER SURGICAL LIGHT HANDLE (MISCELLANEOUS) ×3 IMPLANT
COVER WAND RF STERILE (DRAPES) ×3 IMPLANT
DRAPE HALF SHEET 40X57 (DRAPES) ×9 IMPLANT
DRAPE LAPAROSCOPIC ABDOMINAL (DRAPES) ×3 IMPLANT
DRAPE UTILITY W/TAPE 26X15 (DRAPES) ×3 IMPLANT
DRAPE UTILITY XL STRL (DRAPES) ×3 IMPLANT
DRAPE WARM FLUID 44X44 (DRAPES) ×3 IMPLANT
DRSG OPSITE POSTOP 4X10 (GAUZE/BANDAGES/DRESSINGS) ×3 IMPLANT
DRSG OPSITE POSTOP 4X8 (GAUZE/BANDAGES/DRESSINGS) IMPLANT
ELECT BLADE 6.5 EXT (BLADE) ×3 IMPLANT
ELECT CAUTERY BLADE 6.4 (BLADE) ×3 IMPLANT
ELECT REM PT RETURN 9FT ADLT (ELECTROSURGICAL) ×3
ELECTRODE REM PT RTRN 9FT ADLT (ELECTROSURGICAL) ×1 IMPLANT
GLOVE BIO SURGEON STRL SZ8 (GLOVE) ×6 IMPLANT
GLOVE BIOGEL PI IND STRL 8 (GLOVE) ×2 IMPLANT
GLOVE BIOGEL PI INDICATOR 8 (GLOVE) ×4
GOWN STRL REUS W/ TWL LRG LVL3 (GOWN DISPOSABLE) ×4 IMPLANT
GOWN STRL REUS W/ TWL XL LVL3 (GOWN DISPOSABLE) ×2 IMPLANT
GOWN STRL REUS W/TWL LRG LVL3 (GOWN DISPOSABLE) ×8
GOWN STRL REUS W/TWL XL LVL3 (GOWN DISPOSABLE) ×4
HANDLE SUCTION POOLE (INSTRUMENTS) ×1 IMPLANT
KIT BASIN OR (CUSTOM PROCEDURE TRAY) ×6 IMPLANT
KIT SIGMOIDOSCOPE (SET/KITS/TRAYS/PACK) IMPLANT
KIT TURNOVER KIT B (KITS) ×3 IMPLANT
LEGGING LITHOTOMY PAIR STRL (DRAPES) ×3 IMPLANT
LIGASURE IMPACT 36 18CM CVD LR (INSTRUMENTS) ×9 IMPLANT
NS IRRIG 1000ML POUR BTL (IV SOLUTION) ×6 IMPLANT
PACK GENERAL/GYN (CUSTOM PROCEDURE TRAY) ×3 IMPLANT
PAD ARMBOARD 7.5X6 YLW CONV (MISCELLANEOUS) ×6 IMPLANT
PENCIL SMOKE EVACUATOR (MISCELLANEOUS) ×3 IMPLANT
RELOAD PROXIMATE 75MM BLUE (ENDOMECHANICALS) ×3 IMPLANT
SPECIMEN JAR X LARGE (MISCELLANEOUS) ×3 IMPLANT
SPONGE LAP 18X18 RF (DISPOSABLE) IMPLANT
STAPLER GUN LINEAR PROX 60 (STAPLE) ×3 IMPLANT
STAPLER PROXIMATE 75MM BLUE (STAPLE) ×3 IMPLANT
STAPLER VISISTAT 35W (STAPLE) ×3 IMPLANT
SUCTION POOLE HANDLE (INSTRUMENTS) ×3
SURGILUBE 2OZ TUBE FLIPTOP (MISCELLANEOUS) IMPLANT
SUT PDS AB 1 TP1 96 (SUTURE) ×12 IMPLANT
SUT PROLENE 2 0 CT2 30 (SUTURE) IMPLANT
SUT PROLENE 2 0 KS (SUTURE) IMPLANT
SUT SILK 2 0 SH CR/8 (SUTURE) ×3 IMPLANT
SUT SILK 2 0 TIES 10X30 (SUTURE) ×3 IMPLANT
SUT SILK 3 0 SH CR/8 (SUTURE) ×3 IMPLANT
SUT SILK 3 0 TIES 10X30 (SUTURE) ×3 IMPLANT
SUT VIC AB 3-0 SH 18 (SUTURE) IMPLANT
SYR BULB IRRIGATION 50ML (SYRINGE) ×3 IMPLANT
TOWEL GREEN STERILE (TOWEL DISPOSABLE) ×6 IMPLANT
TRAY FOLEY MTR SLVR 14FR STAT (SET/KITS/TRAYS/PACK) ×3 IMPLANT
TUBE CONNECTING 12'X1/4 (SUCTIONS) ×1
TUBE CONNECTING 12X1/4 (SUCTIONS) ×2 IMPLANT
UNDERPAD 30X30 (UNDERPADS AND DIAPERS) IMPLANT
YANKAUER SUCT BULB TIP NO VENT (SUCTIONS) ×3 IMPLANT

## 2020-02-03 NOTE — Anesthesia Procedure Notes (Signed)
Procedure Name: Intubation Date/Time: 02/03/2020 8:49 AM Performed by: Modena Morrow, CRNA Pre-anesthesia Checklist: Patient identified, Emergency Drugs available, Suction available and Patient being monitored Patient Re-evaluated:Patient Re-evaluated prior to induction Oxygen Delivery Method: Circle system utilized Preoxygenation: Pre-oxygenation with 100% oxygen Induction Type: IV induction Ventilation: Mask ventilation without difficulty Laryngoscope Size: Miller and 3 Grade View: Grade I Tube type: Oral Tube size: 7.5 mm Number of attempts: 1 Airway Equipment and Method: Stylet and Oral airway Placement Confirmation: ETT inserted through vocal cords under direct vision,  positive ETCO2 and breath sounds checked- equal and bilateral Secured at: 23 cm Tube secured with: Tape Dental Injury: Teeth and Oropharynx as per pre-operative assessment
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
## 2020-02-03 NOTE — Progress Notes (Signed)
Received patient from PACU, drowsy but easily arousable, with PCA, VSS, oriented to room, bed controls, call light and plan of care.  Will monitor.

## 2020-02-03 NOTE — Anesthesia Postprocedure Evaluation (Signed)
Anesthesia Post Note  Patient: Zachary Collier  Procedure(s) Performed: TOTAL COLECTOMY (Right )     Patient location during evaluation: PACU Anesthesia Type: General Level of consciousness: awake and alert Pain management: pain level controlled Vital Signs Assessment: post-procedure vital signs reviewed and stable Respiratory status: spontaneous breathing, nonlabored ventilation, respiratory function stable and patient connected to nasal cannula oxygen Cardiovascular status: blood pressure returned to baseline and stable Postop Assessment: no apparent nausea or vomiting Anesthetic complications: no    Last Vitals:  Vitals:   02/03/20 1355 02/03/20 1514  BP: 106/63   Pulse: 83   Resp: 18 16  Temp: 36.8 C   SpO2: 95%     Last Pain:  Vitals:   02/03/20 1514  TempSrc:   PainSc: 5                  Kennieth Rad

## 2020-02-03 NOTE — Progress Notes (Signed)
Phone call received from ED security regarding patient.  Family was looking for an update.  This nurse checked the [facility] tower waiting area while transporting the patient and was unable to find anyone.  This nurse was getting ready to call family and give them an update after transporting another patient.  Security said they would call [facility] and see if they could allow the family a brief visit.

## 2020-02-03 NOTE — Transfer of Care (Signed)
Immediate Anesthesia Transfer of Care Note  Patient: Zachary Collier  Procedure(s) Performed: TOTAL COLECTOMY (Right )  Patient Location: PACU  Anesthesia Type:General  Level of Consciousness: drowsy and patient cooperative  Airway & Oxygen Therapy: Patient Spontanous Breathing and Patient connected to face mask oxygen  Post-op Assessment: Report given to RN and Post -op Vital signs reviewed and stable  Post vital signs: Reviewed and stable  Last Vitals:  Vitals Value Taken Time  BP    Temp    Pulse    Resp    SpO2      Last Pain:  Vitals:   02/03/20 0759  TempSrc:   PainSc: 5       Patients Stated Pain Goal: 4 (02/03/20 0759)  Complications: No apparent anesthesia complications

## 2020-02-04 LAB — BASIC METABOLIC PANEL
Anion gap: 8 (ref 5–15)
BUN: 7 mg/dL (ref 6–20)
CO2: 28 mmol/L (ref 22–32)
Calcium: 8.6 mg/dL — ABNORMAL LOW (ref 8.9–10.3)
Chloride: 106 mmol/L (ref 98–111)
Creatinine, Ser: 1.08 mg/dL (ref 0.61–1.24)
GFR calc Af Amer: >60 mL/min (ref >60)
GFR calc non Af Amer: >60 mL/min (ref >60)
Glucose, Bld: 98 mg/dL (ref 70–99)
Potassium: 4.2 mmol/L (ref 3.5–5.1)
Sodium: 142 mmol/L (ref 135–145)

## 2020-02-04 LAB — CBC
HCT: 40.7 % (ref 39.0–52.0)
Hemoglobin: 13.1 g/dL (ref 13.0–17.0)
MCH: 30.8 pg (ref 26.0–34.0)
MCHC: 32.2 g/dL (ref 30.0–36.0)
MCV: 95.5 fL (ref 80.0–100.0)
Platelets: 242 10*3/uL (ref 150–400)
RBC: 4.26 MIL/uL (ref 4.22–5.81)
RDW: 13.4 % (ref 11.5–15.5)
WBC: 13.5 10*3/uL — ABNORMAL HIGH (ref 4.0–10.5)
nRBC: 0 % (ref 0.0–0.2)

## 2020-02-04 LAB — SURGICAL PATHOLOGY

## 2020-02-04 MED ORDER — METHOCARBAMOL 1000 MG/10ML IJ SOLN
1000.0000 mg | Freq: Four times a day (QID) | INTRAVENOUS | Status: DC | PRN
  Filled 2020-02-04: qty 10

## 2020-02-04 MED ORDER — OXYCODONE HCL 5 MG/5ML PO SOLN
5.0000 mg | ORAL | Status: DC | PRN
  Administered 2020-02-04 – 2020-02-05 (×4): 10 mg via ORAL
  Filled 2020-02-04 (×5): qty 10

## 2020-02-04 MED ORDER — ACETAMINOPHEN 500 MG PO TABS
1000.0000 mg | ORAL_TABLET | Freq: Four times a day (QID) | ORAL | Status: DC
  Administered 2020-02-04 – 2020-02-07 (×9): 1000 mg via ORAL
  Filled 2020-02-04 (×10): qty 2

## 2020-02-04 MED ORDER — DOCUSATE SODIUM 50 MG/5ML PO LIQD
100.0000 mg | Freq: Two times a day (BID) | ORAL | Status: DC
  Administered 2020-02-04 – 2020-02-06 (×5): 100 mg via ORAL
  Filled 2020-02-04 (×7): qty 10

## 2020-02-04 MED ORDER — MORPHINE SULFATE (PF) 2 MG/ML IV SOLN
2.0000 mg | INTRAVENOUS | Status: DC | PRN
  Administered 2020-02-04 – 2020-02-06 (×4): 2 mg via INTRAVENOUS
  Filled 2020-02-04 (×4): qty 1

## 2020-02-04 MED ORDER — HEPARIN SODIUM (PORCINE) 5000 UNIT/ML IJ SOLN
5000.0000 [IU] | Freq: Three times a day (TID) | INTRAMUSCULAR | Status: DC
  Administered 2020-02-04: 5000 [IU] via SUBCUTANEOUS
  Filled 2020-02-04: qty 1

## 2020-02-04 MED ORDER — ENOXAPARIN SODIUM 30 MG/0.3ML ~~LOC~~ SOLN
30.0000 mg | Freq: Two times a day (BID) | SUBCUTANEOUS | Status: DC
  Administered 2020-02-04 – 2020-02-07 (×6): 30 mg via SUBCUTANEOUS
  Filled 2020-02-04 (×6): qty 0.3

## 2020-02-04 MED ORDER — METHOCARBAMOL 500 MG PO TABS
1000.0000 mg | ORAL_TABLET | Freq: Three times a day (TID) | ORAL | Status: DC
  Administered 2020-02-04 – 2020-02-07 (×9): 1000 mg via ORAL
  Filled 2020-02-04 (×9): qty 2

## 2020-02-04 MED ORDER — CHLORHEXIDINE GLUCONATE CLOTH 2 % EX PADS: 6.0000 | MEDICATED_PAD | Freq: Every day | CUTANEOUS | Status: DC
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
## 2020-02-04 NOTE — Social Work (Signed)
CSW met with pt at bedside. CSW introduced self and explained her role. CSW completed sbirt with pt.Pt scored a 8 on the sbirt scale. Pt reports drinking alcohol a few times a week and may have 2-3 shots in a day. Pt stated that he doesn't think he has a problem with alcohol use but recognizes that he should cut back on his use.   CSW inquired about substance use. Pt stated that he smokes marijuana a couple times a day. Pt states he uses it because its available and it keeps him from getting angry.   CSW and pt discussed treatment options available to the pt. Pt stated he does not have a problem with marijuana use and that he can stop if he wants to.   CSW offered resources and pt accepted them.  Emeterio Reeve, Latanya Presser, [place] Social Worker [phone]

## 2020-02-04 NOTE — Discharge Instructions (Signed)
[facility]      [facility] [phone]  OPEN ABDOMINAL SURGERY: POST OP INSTRUCTIONS  Always review your discharge instruction sheet given to you by the facility where your surgery was performed.  IF YOU HAVE DISABILITY OR FAMILY LEAVE FORMS, YOU MUST BRING THEM TO THE OFFICE FOR PROCESSING.  PLEASE DO NOT GIVE THEM TO YOUR DOCTOR.  1. A prescription for pain medication may be given to you upon discharge.  Take your pain medication as prescribed, if needed.  If narcotic pain medicine is not needed, then you may take acetaminophen (Tylenol) or ibuprofen (Advil) as needed. 2. Take your usually prescribed medications unless otherwise directed. 3. If you need a refill on your pain medication, please contact your pharmacy. They will contact our office to request authorization.  Prescriptions will not be filled after 5pm or on week-ends. 4. You should follow a light diet the first few days after arrival home, such as soup and crackers, pudding, etc.unless your doctor has advised otherwise. A high-fiber, low fat diet can be resumed as tolerated.   Be sure to include lots of fluids daily. Most patients will experience some swelling and bruising on the chest and neck area.  Ice packs will help.  Swelling and bruising can take several days to resolve 5. Most patients will experience some swelling and bruising in the area of the incision. Ice pack will help. Swelling and bruising can take several days to resolve..  6. It is common to experience some constipation if taking pain medication after surgery.  Increasing fluid intake and taking a stool softener will usually help or prevent this problem from occurring.  A mild laxative (Milk of Magnesia or Miralax) should be taken according to package directions if there are no bowel movements after 48 hours. 7.  You may have steri-strips (small skin tapes) in place directly over the incision.  These strips should be left on the skin for 7-10 days.  If your  surgeon used skin glue on the incision, you may shower in 24 hours.  The glue will flake off over the next 2-3 weeks.  Any sutures or staples will be removed at the office during your follow-up visit. You may find that a light gauze bandage over your incision may keep your staples from being rubbed or pulled. You may shower and replace the bandage daily. 8. ACTIVITIES:  You may resume regular (light) daily activities beginning the next day--such as daily self-care, walking, climbing stairs--gradually increasing activities as tolerated.  You may have sexual intercourse when it is comfortable.  Refrain from any heavy lifting or straining until approved by your doctor. a. You may drive when you no longer are taking prescription pain medication, you can comfortably wear a seatbelt, and you can safely maneuver your car and apply brakes b. Return to Work: ___________________________________ 9. You should see your doctor in the office for a follow-up appointment approximately two weeks after your surgery.  Make sure that you call for this appointment within a day or two after you arrive home to insure a convenient appointment time. OTHER INSTRUCTIONS:  _____________________________________________________________ _____________________________________________________________  WHEN TO CALL YOUR DOCTOR: 1. Fever over 101.0 2. Inability to urinate 3. Nausea and/or vomiting 4. Extreme swelling or bruising 5. Continued bleeding from incision. 6. Increased pain, redness, or drainage from the incision. 7. Difficulty swallowing or breathing 8. Muscle cramping or spasms. 9. Numbness or tingling in hands or feet or around lips.  The clinic staff is available to   answer your questions during regular business hours.  Please don't hesitate to call and ask to speak to one of the nurses if you have concerns.  For further questions, please visit [email or web address]   
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
## 2020-02-05 LAB — CBC
HCT: 38.9 % — ABNORMAL LOW (ref 39.0–52.0)
Hemoglobin: 12.4 g/dL — ABNORMAL LOW (ref 13.0–17.0)
MCH: 30.2 pg (ref 26.0–34.0)
MCHC: 31.9 g/dL (ref 30.0–36.0)
MCV: 94.9 fL (ref 80.0–100.0)
Platelets: 220 10*3/uL (ref 150–400)
RBC: 4.1 MIL/uL — ABNORMAL LOW (ref 4.22–5.81)
RDW: 13.2 % (ref 11.5–15.5)
WBC: 9 10*3/uL (ref 4.0–10.5)
nRBC: 0 % (ref 0.0–0.2)

## 2020-02-05 LAB — BASIC METABOLIC PANEL
Anion gap: 8 (ref 5–15)
BUN: <5 mg/dL — ABNORMAL LOW (ref 6–20)
CO2: 30 mmol/L (ref 22–32)
Calcium: 8.7 mg/dL — ABNORMAL LOW (ref 8.9–10.3)
Chloride: 103 mmol/L (ref 98–111)
Creatinine, Ser: 1.12 mg/dL (ref 0.61–1.24)
GFR calc Af Amer: >60 mL/min (ref >60)
GFR calc non Af Amer: >60 mL/min (ref >60)
Glucose, Bld: 90 mg/dL (ref 70–99)
Potassium: 3.6 mmol/L (ref 3.5–5.1)
Sodium: 141 mmol/L (ref 135–145)
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
## 2020-02-06 LAB — BASIC METABOLIC PANEL
Anion gap: 7 (ref 5–15)
BUN: 5 mg/dL — ABNORMAL LOW (ref 6–20)
CO2: 29 mmol/L (ref 22–32)
Calcium: 9.2 mg/dL (ref 8.9–10.3)
Chloride: 105 mmol/L (ref 98–111)
Creatinine, Ser: 0.95 mg/dL (ref 0.61–1.24)
GFR calc Af Amer: >60 mL/min (ref >60)
GFR calc non Af Amer: >60 mL/min (ref >60)
Glucose, Bld: 89 mg/dL (ref 70–99)
Potassium: 4.9 mmol/L (ref 3.5–5.1)
Sodium: 141 mmol/L (ref 135–145)

## 2020-02-06 LAB — CBC
HCT: 39.2 % (ref 39.0–52.0)
Hemoglobin: 12.7 g/dL — ABNORMAL LOW (ref 13.0–17.0)
MCH: 29.8 pg (ref 26.0–34.0)
MCHC: 32.4 g/dL (ref 30.0–36.0)
MCV: 92 fL (ref 80.0–100.0)
Platelets: 251 10*3/uL (ref 150–400)
RBC: 4.26 MIL/uL (ref 4.22–5.81)
RDW: 12.8 % (ref 11.5–15.5)
WBC: 8 10*3/uL (ref 4.0–10.5)
nRBC: 0 % (ref 0.0–0.2)

## 2020-02-06 MED ORDER — MORPHINE SULFATE (PF) 2 MG/ML IV SOLN: 2.0000 mg | INTRAVENOUS | Status: DC | PRN

## 2020-02-06 MED ORDER — LIDOCAINE 5 % EX PTCH
1.0000 | MEDICATED_PATCH | CUTANEOUS | Status: DC
  Administered 2020-02-06: 1 via TRANSDERMAL
  Filled 2020-02-06: qty 1
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
## 2020-02-07 MED ORDER — METHOCARBAMOL 500 MG PO TABS: 1000.0000 mg | ORAL_TABLET | Freq: Three times a day (TID) | ORAL | 0 refills | Status: AC | PRN

## 2020-02-07 MED ORDER — AMOXICILLIN-POT CLAVULANATE 875-125 MG PO TABS
1.0000 | ORAL_TABLET | Freq: Two times a day (BID) | ORAL | 0 refills | Status: AC
Start: 2020-02-07 — End: 2020-02-21

## 2020-02-07 MED ORDER — OXYCODONE HCL 5 MG PO TABS: 5.0000 mg | ORAL_TABLET | Freq: Four times a day (QID) | ORAL | 0 refills | Status: AC | PRN
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
## 2020-02-07 NOTE — Progress Notes (Signed)
Patient discharged to home. Verbalizes understanding of all discharge instructions including incision care, discharge medications, and follow up MD visits. Patient's father at the bedside for discharge instructions.

## 2020-02-09 ENCOUNTER — Encounter (HOSPITAL_COMMUNITY): Payer: Self-pay | Admitting: Emergency Medicine
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
## 2021-07-31 IMAGING — DX DG CHEST 1V PORT
1 series · 1 of 1 positions shown · non-contrast
Comparison: October 28, 2007.

CLINICAL DATA: Productive cough.

EXAM:
PORTABLE CHEST 1 VIEW

[chest ap]
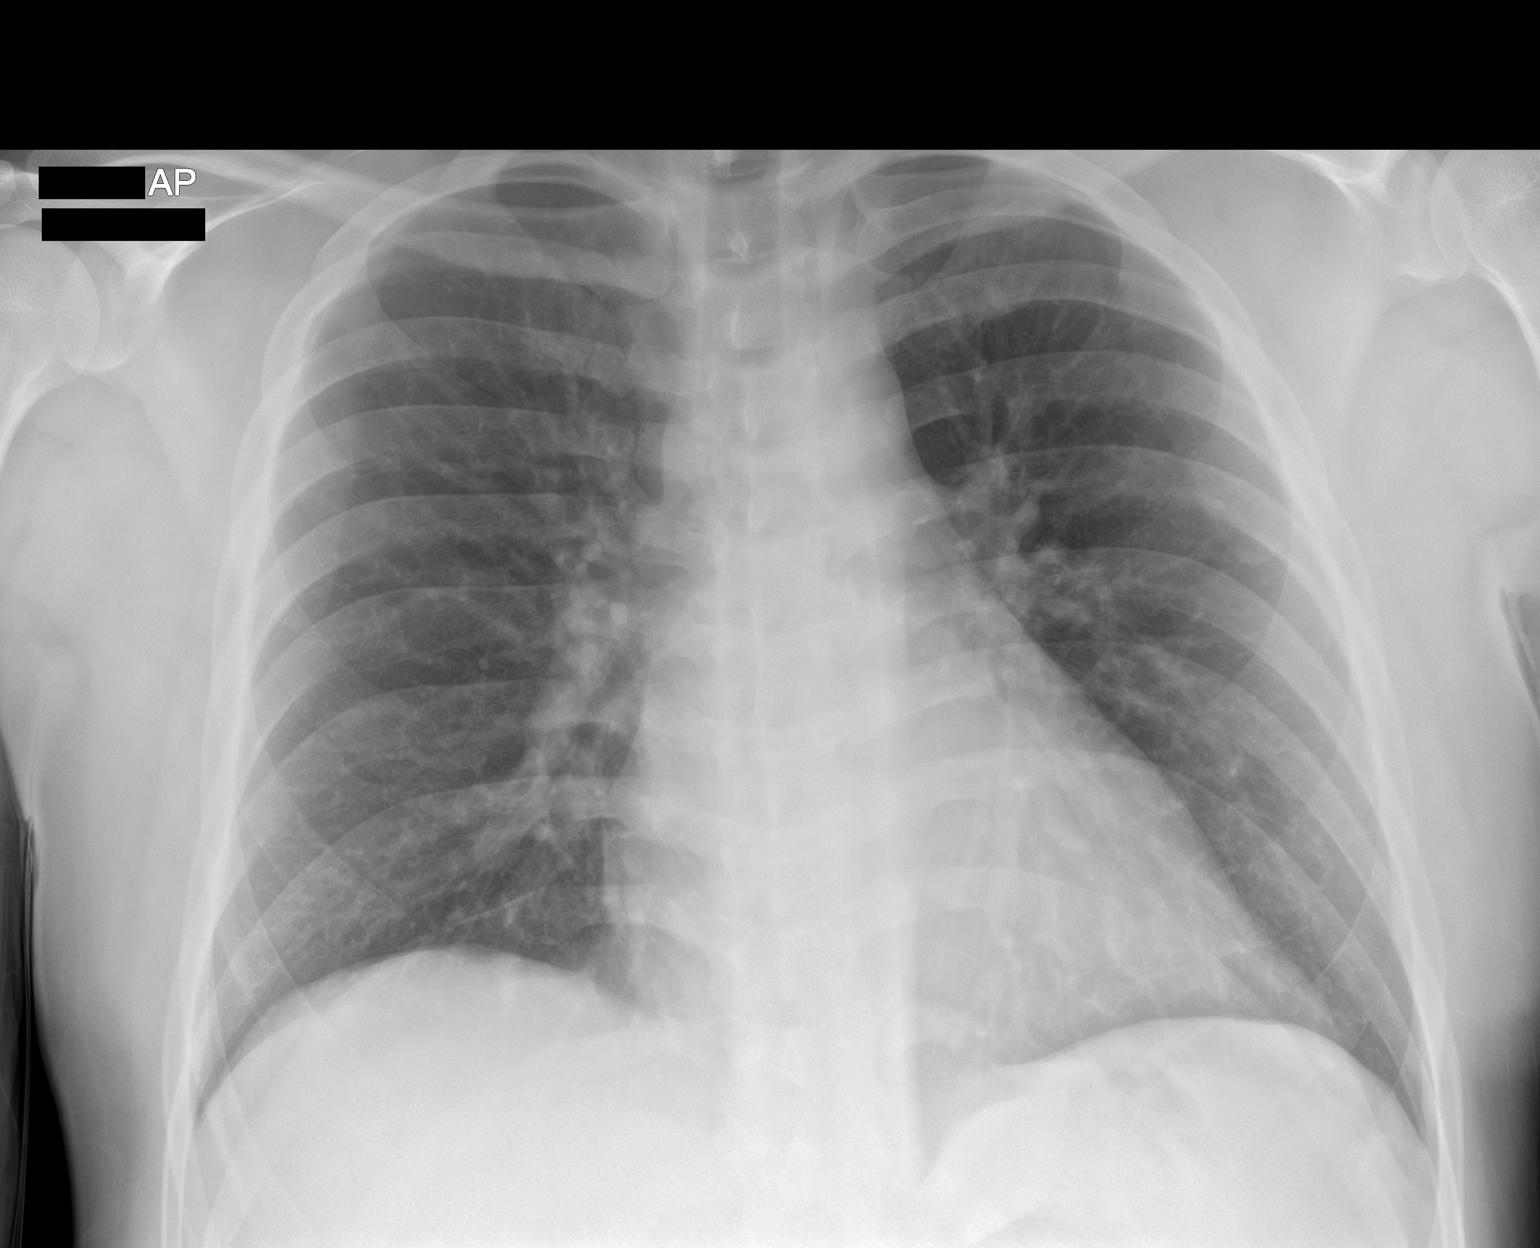

[1 of 1 positions shown; findings below may reference images not displayed]

FINDINGS: The heart size and mediastinal contours are within normal limits.
Both lungs are clear. The visualized skeletal structures are
unremarkable.
IMPRESSION: No active disease.
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
